# Patient Record
Sex: Female | Born: 1970 | Race: White | Hispanic: No | Marital: Married | Smoking: Never smoker
Health system: Southern US, Community
[De-identification: ages and names within clinical notes are randomized; demographics above are authoritative.]

---

## 2018-06-26 ENCOUNTER — Emergency Department (HOSPITAL_COMMUNITY)
Admission: EM | Admit: 2018-06-26 | Discharge: 2018-06-26 | Disposition: A | Discharge status: Home / Self Care | Payer: Self-pay | Attending: Emergency Medicine | Admitting: Emergency Medicine

## 2018-06-26 ENCOUNTER — Encounter (HOSPITAL_COMMUNITY): Payer: Self-pay

## 2018-06-26 ENCOUNTER — Emergency Department (HOSPITAL_COMMUNITY): Payer: Self-pay

## 2018-06-26 ENCOUNTER — Other Ambulatory Visit: Payer: Self-pay

## 2018-06-26 DIAGNOSIS — D259 Leiomyoma of uterus, unspecified: Secondary | ICD-10-CM | POA: Insufficient documentation

## 2018-06-26 DIAGNOSIS — R339 Retention of urine, unspecified: Secondary | ICD-10-CM | POA: Insufficient documentation

## 2018-06-26 DIAGNOSIS — D219 Benign neoplasm of connective and other soft tissue, unspecified: Secondary | ICD-10-CM

## 2018-06-26 LAB — CBC WITH DIFFERENTIAL/PLATELET
Abs Immature Granulocytes: 0.1 10*3/uL (ref 0.0–0.1)
Basophils Absolute: 0 10*3/uL (ref 0.0–0.1)
Basophils Relative: 0 %
EOS PCT: 1 %
Eosinophils Absolute: 0.1 10*3/uL (ref 0.0–0.7)
HEMATOCRIT: 40.2 % (ref 36.0–46.0)
HEMOGLOBIN: 12.9 g/dL (ref 12.0–15.0)
Immature Granulocytes: 1 %
LYMPHS ABS: 1.7 10*3/uL (ref 0.7–4.0)
LYMPHS PCT: 17 %
MCH: 28.2 pg (ref 26.0–34.0)
MCHC: 32.1 g/dL (ref 30.0–36.0)
MCV: 87.8 fL (ref 78.0–100.0)
Monocytes Absolute: 0.7 10*3/uL (ref 0.1–1.0)
Monocytes Relative: 7 %
Neutro Abs: 7.4 10*3/uL (ref 1.7–7.7)
Neutrophils Relative %: 74 %
Platelets: 254 10*3/uL (ref 150–400)
RBC: 4.58 MIL/uL (ref 3.87–5.11)
RDW: 14 % (ref 11.5–15.5)
WBC: 10 10*3/uL (ref 4.0–10.5)

## 2018-06-26 LAB — I-STAT CHEM 8, ED
BUN: 11 mg/dL (ref 6–20)
CREATININE: 0.7 mg/dL (ref 0.44–1.00)
Calcium, Ion: 1.06 mmol/L — ABNORMAL LOW (ref 1.15–1.40)
Chloride: 107 mmol/L (ref 98–111)
Glucose, Bld: 121 mg/dL — ABNORMAL HIGH (ref 70–99)
HCT: 40 % (ref 36.0–46.0)
Hemoglobin: 13.6 g/dL (ref 12.0–15.0)
Potassium: 4.1 mmol/L (ref 3.5–5.1)
Sodium: 134 mmol/L — ABNORMAL LOW (ref 135–145)
TCO2: 17 mmol/L — AB (ref 22–32)

## 2018-06-26 LAB — URINALYSIS, ROUTINE W REFLEX MICROSCOPIC
BILIRUBIN URINE: NEGATIVE
GLUCOSE, UA: NEGATIVE mg/dL
HGB URINE DIPSTICK: NEGATIVE
Ketones, ur: NEGATIVE mg/dL
Leukocytes, UA: NEGATIVE
NITRITE: POSITIVE — AB
PH: 5 (ref 5.0–8.0)
PROTEIN: NEGATIVE mg/dL
Specific Gravity, Urine: 1.009 (ref 1.005–1.030)

## 2018-06-26 LAB — I-STAT CG4 LACTIC ACID, ED: Lactic Acid, Venous: 2.26 mmol/L (ref 0.5–1.9)

## 2018-06-26 LAB — I-STAT BETA HCG BLOOD, ED (MC, WL, AP ONLY): I-stat hCG, quantitative: <5 m[IU]/mL (ref <5)

## 2018-06-26 MED ORDER — MORPHINE SULFATE (PF) 4 MG/ML IV SOLN
4.0000 mg | Freq: Once | INTRAVENOUS | Status: AC
  Administered 2018-06-26: 4 mg via INTRAVENOUS
  Filled 2018-06-26: qty 1

## 2018-06-26 MED ORDER — PHENAZOPYRIDINE HCL 100 MG PO TABS
200.0000 mg | ORAL_TABLET | Freq: Once | ORAL | Status: DC
  Filled 2018-06-26: qty 2

## 2018-06-26 MED ORDER — CEPHALEXIN 250 MG PO CAPS
500.0000 mg | ORAL_CAPSULE | Freq: Once | ORAL | Status: DC
  Filled 2018-06-26: qty 2

## 2018-06-26 MED ORDER — ONDANSETRON HCL 4 MG/2ML IJ SOLN
4.0000 mg | Freq: Once | INTRAMUSCULAR | Status: AC
  Administered 2018-06-26: 4 mg via INTRAVENOUS
  Filled 2018-06-26: qty 2

## 2018-06-26 MED ORDER — CEPHALEXIN 500 MG PO CAPS: 500.0000 mg | ORAL_CAPSULE | Freq: Four times a day (QID) | ORAL | 0 refills | Status: AC

## 2018-06-26 MED ORDER — SODIUM CHLORIDE 0.9 % IV BOLUS
500.0000 mL | Freq: Once | INTRAVENOUS | Status: AC
  Administered 2018-06-26: 500 mL via INTRAVENOUS

## 2018-06-26 MED ORDER — PHENAZOPYRIDINE HCL 200 MG PO TABS: 200.0000 mg | ORAL_TABLET | Freq: Three times a day (TID) | ORAL | 0 refills | Status: AC | PRN

## 2018-06-26 NOTE — ED Notes (Signed)
Patient ambulatory to bathroom with steady gait at this time 

## 2018-06-26 NOTE — ED Triage Notes (Signed)
Pt here from home for painful urination and urinating blood.  Patient states had similar symptoms a week ago.  States she attempted to use the restroom and had bloody urination and some diarrhea.  Also feels like she is retaining urine.

## 2018-06-26 NOTE — ED Notes (Signed)
Patient verbalizes understanding of discharge instructions. Opportunity for questioning and answers were provided. Armband removed by staff, pt discharged from ED. Pt also states husband is driving her home d/t morphine administration.

## 2018-06-26 NOTE — ED Provider Notes (Signed)
[facility] EMERGENCY DEPARTMENT Provider Note   CSN: [id number] Arrival date & time: 06/26/18  0606     History   Chief Complaint Chief Complaint  Patient presents with  . Hematuria    HPI Audrey Hayes is a 47 y.o. female.  Pt presents to the ED today with the inability to urinate.  She said she had dysuria last week and it went away after drinking cranberry juice.  She had some blood come out when she urinate.  She denies any back pain.  She has never had anything like this in the past.     History reviewed. No pertinent past medical history.  There are no active problems to display for this patient.   History reviewed. No pertinent surgical history.   OB History   None      Home Medications    Prior to Admission medications   Medication Sig Start Date End Date Taking? Authorizing Provider  cephALEXin (KEFLEX) 500 MG capsule Take 1 capsule (500 mg total) by mouth 4 (four) times daily. 06/26/18   Isla Pence, MD  phenazopyridine (PYRIDIUM) 200 MG tablet Take 1 tablet (200 mg total) by mouth 3 (three) times daily as needed for pain. 06/26/18   Isla Pence, MD    Family History History reviewed. No pertinent family history.  Social History Social History   Tobacco Use  . Smoking status: Never Smoker  . Smokeless tobacco: Never Used  Substance Use Topics  . Alcohol use: Never    Frequency: Never  . Drug use: Never     Allergies   Patient has no known allergies.   Review of Systems Review of Systems  Genitourinary: Positive for difficulty urinating, dysuria and hematuria.  All other systems reviewed and are negative.    Physical Exam Updated Vital Signs BP 130/68   Pulse 85   Temp 98.8 F (37.1 C) (Oral)   Resp 18   LMP 06/11/2018 (Within Days)   SpO2 98%   Physical Exam  Constitutional: She is oriented to person, place, and time. She appears well-developed and well-nourished.  HENT:  Head: Normocephalic and  atraumatic.  Right Ear: External ear normal.  Left Ear: External ear normal.  Nose: Nose normal.  Mouth/Throat: Oropharynx is clear and moist.  Eyes: Pupils are equal, round, and reactive to light. Conjunctivae and EOM are normal.  Neck: Normal range of motion. Neck supple.  Cardiovascular: Normal rate, regular rhythm, normal heart sounds and intact distal pulses.  Pulmonary/Chest: Effort normal and breath sounds normal.  Abdominal: Soft. Bowel sounds are normal. There is tenderness in the suprapubic area.  Musculoskeletal: Normal range of motion.  Neurological: She is alert and oriented to person, place, and time.  Skin: Skin is warm. Capillary refill takes less than 2 seconds.  Psychiatric: She has a normal mood and affect. Her behavior is normal. Judgment and thought content normal.  Nursing note and vitals reviewed.    ED Treatments / Results  Labs (all labs ordered are listed, but only abnormal results are displayed) Labs Reviewed  URINALYSIS, ROUTINE W REFLEX MICROSCOPIC - Abnormal; Notable for the following components:      Result Value   Color, Urine AMBER (*)    Nitrite POSITIVE (*)    Bacteria, UA RARE (*)    All other components within normal limits  I-STAT CHEM 8, ED - Abnormal; Notable for the following components:   Sodium 134 (*)    Glucose, Bld 121 (*)  Calcium, Ion 1.06 (*)    TCO2 17 (*)    All other components within normal limits  I-STAT CG4 LACTIC ACID, ED - Abnormal; Notable for the following components:   Lactic Acid, Venous 2.26 (*)    All other components within normal limits  URINE CULTURE  CBC WITH DIFFERENTIAL/PLATELET  I-STAT BETA HCG BLOOD, ED ([facility], [facility], [facility] ONLY)  I-STAT BETA HCG BLOOD, ED ([facility], [facility], [facility] ONLY)    EKG None  Radiology Ct Renal Stone Study  Result Date: 06/26/2018 CLINICAL DATA:  47 year old female with a history of lower abdominal pain and urinary urgency EXAM: CT ABDOMEN AND PELVIS WITHOUT CONTRAST TECHNIQUE: Multidetector CT  imaging of the abdomen and pelvis was performed following the standard protocol without IV contrast. COMPARISON:  None. FINDINGS: Lower chest: No acute abnormality. Hepatobiliary: Diffusely decreased attenuation of liver parenchyma. Hyperdense material layered in the dependent aspect of the gallbladder. No pericholecystic fluid or inflammatory changes. No intrahepatic or extrahepatic biliary ductal dilatation. Pancreas: Unremarkable pancreas Spleen: Unremarkable spleen Adrenals/Urinary Tract: Unremarkable adrenal glands. Right kidney and left kidney without hydronephrosis. No nephrolithiasis. Unremarkable course the bilateral ureters. Urinary bladder relatively decompressed. Urinary bladder is displaced anteriorly with impression on the bladder base secondary to the uterus. Stomach/Bowel: Unremarkable stomach. Small duodenal diverticulum. Unremarkable appearance of small bowel with no abnormal distention. No transition point. No focal inflammatory changes. No wall thickening. Normal appendix. Mild diverticular change of the sigmoid colon. No associated inflammatory changes. Vascular/Lymphatic: No significant vascular calcifications. No lymphadenopathy. Reproductive: Uterus is enlarged with evidence of fibroid changes. There appears to be a large posterior fibroid, though incompletely characterized on this CT, with diameter on the axial image of 12 cm x 10 cm. Follicular changes of the adnexa. Other: No abdominal wall hernia or abnormality. No abdominopelvic ascites. Musculoskeletal: No acute displaced fracture. No significant degenerative changes IMPRESSION: No acute CT finding to account for abdominal pain. Enlarged uterus, and though is incompletely characterized on the current noncontrast CT, demonstrates fibroid changes with a large posterior fibroid measuring 12 cm x 10 cm, up lifting the urinary bladder. Referral for Ob evaluation is recommended. 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  Steatosis Electronically Signed   By: Corrie Mckusick D.O.   On: 06/26/2018 09:22    Procedures Procedures (including critical care time)  Medications Ordered in ED Medications  phenazopyridine (PYRIDIUM) tablet 200 mg (has no administration in time range)  cephALEXin (KEFLEX) capsule 500 mg (has no administration in time range)  sodium chloride 0.9 % bolus 500 mL (0 mLs Intravenous Stopped 06/26/18 0823)  morphine 4 MG/ML injection 4 mg (4 mg Intravenous Given 06/26/18 0714)  ondansetron (ZOFRAN) injection 4 mg (4 mg Intravenous Given 06/26/18 0714)  morphine 4 MG/ML injection 4 mg (4 mg Intravenous Given 06/26/18 0943)     Initial Impression / Assessment and Plan / ED Course  I have reviewed the triage vital signs and the nursing notes.  Pertinent labs & imaging results that were available during my care of the patient were reviewed by me and considered in my medical decision making (see chart for details).    Pt had 900 cc of urine in bladder, so she was cath'd.  She was able to urinate only about 50-100 cc after this.  However, post void residual was over 500, so a foley was placed.  Pt has very large fibroids which I think may be the cause of urinary retention.  She does not take any meds which would cause  it.  She just moved here from [place] and does not have a doctor. She has been told before that she has large fibroids.  Hyst was recommended, but she decided to hold off on it.  She is given the number of women's clinic and urology.  She knows to return here or to [facility] in 3-4 days to have foley removed as it's unlikely she will be able to get an appt as she does not have insurance.  She will be started on keflex b/c of the dysuria and the foley.  She is feeling much better.  She knows to return if worse.  Final Clinical Impressions(s) / ED Diagnoses   Final diagnoses:  Urinary retention  Fibroids    ED Discharge Orders         Ordered    cephALEXin (KEFLEX) 500 MG capsule  4 times  daily     06/26/18 1014    phenazopyridine (PYRIDIUM) 200 MG tablet  3 times daily PRN     06/26/18 1014           Isla Pence, MD 06/26/18 1019

## 2018-06-26 NOTE — ED Notes (Signed)
Nurse starting IV and will draw labs. 

## 2018-06-26 NOTE — ED Notes (Signed)
Patient transported to CT 

## 2018-06-26 NOTE — ED Notes (Signed)
BBladdr Scan reads 715 ML

## 2018-06-26 NOTE — ED Notes (Signed)
Successful in and out catheterization done. Output 926mLs. Patient reports decreased pain at 3/10.

## 2018-06-27 LAB — URINE CULTURE: CULTURE: NO GROWTH

## 2019-08-19 IMAGING — CT CT RENAL STONE PROTOCOL
2 of 4 series · 16 of 46 positions shown, 18 images · non-contrast
Comparison: None.

CLINICAL DATA: 47-year-old female with a history of lower abdominal
pain and urinary urgency

EXAM:
CT ABDOMEN AND PELVIS WITHOUT CONTRAST
TECHNIQUE: Multidetector CT imaging of the abdomen and pelvis was performed
following the standard protocol without IV contrast.

[Series 3: ap without · axial · non-contrast · 0.72mm/px · z∈[+791,+1176]mm · 13 of 89 slices shown, 15 images]
[im 6/89  soft-tissue]
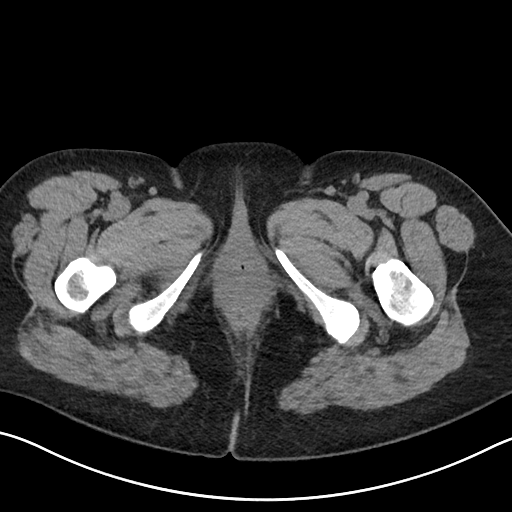
[im 6/89  bone]
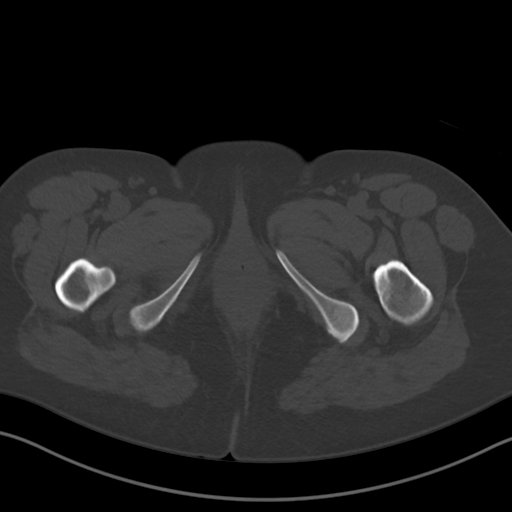
[im 11/89  soft-tissue]
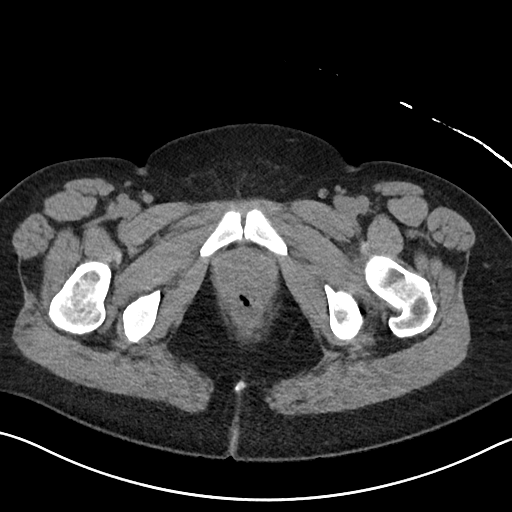
[im 21/89  soft-tissue]
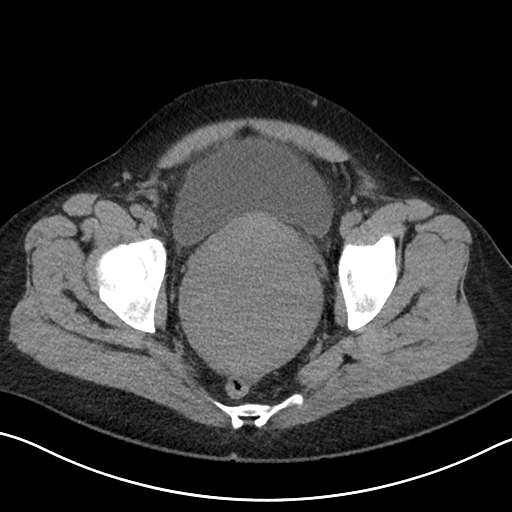
[im 26/89  soft-tissue]
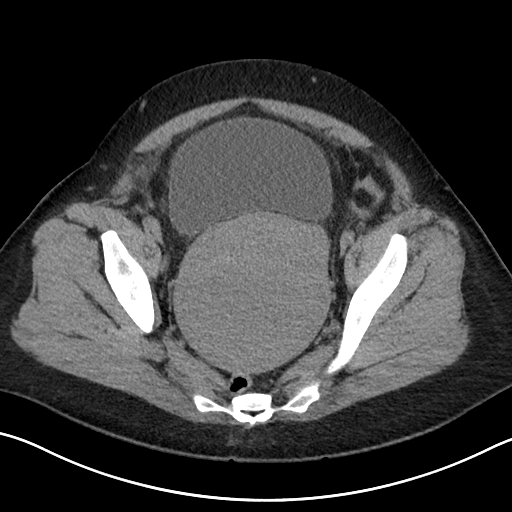
[im 32/89  soft-tissue]
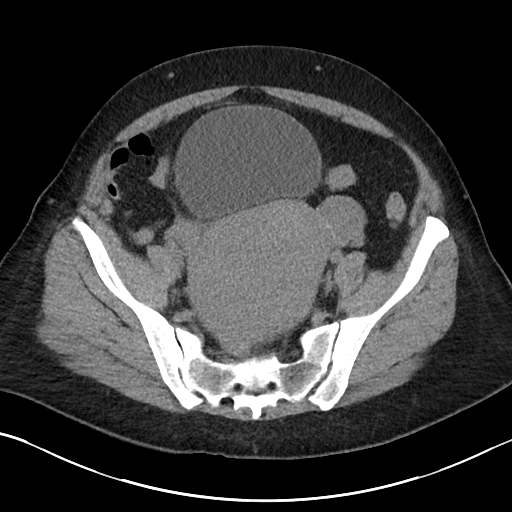
[im 37/89  soft-tissue]
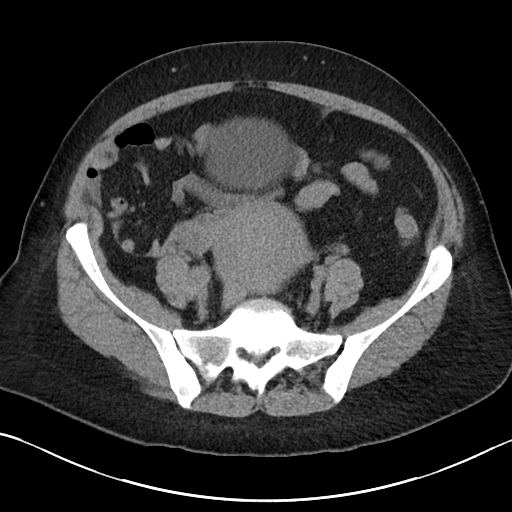
[im 47/89  soft-tissue]
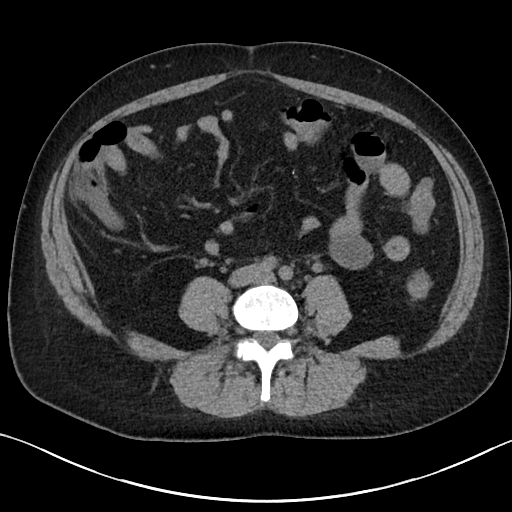
[im 52/89  soft-tissue]
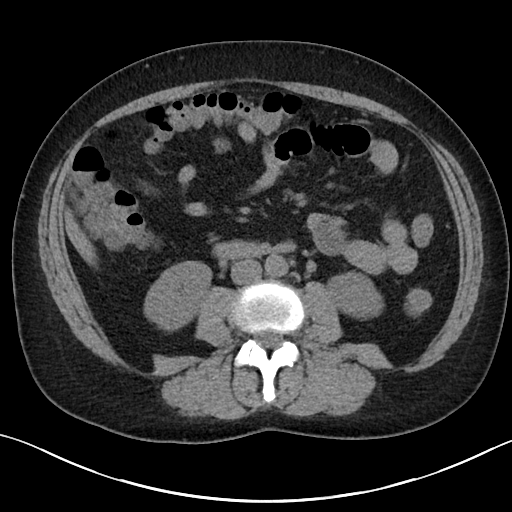
[im 57/89  soft-tissue]
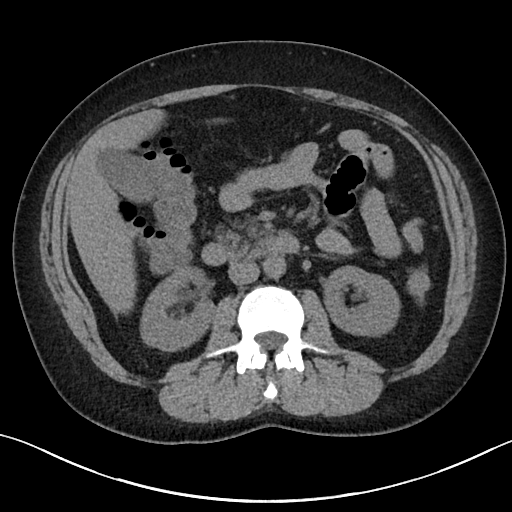
[im 57/89  bone]
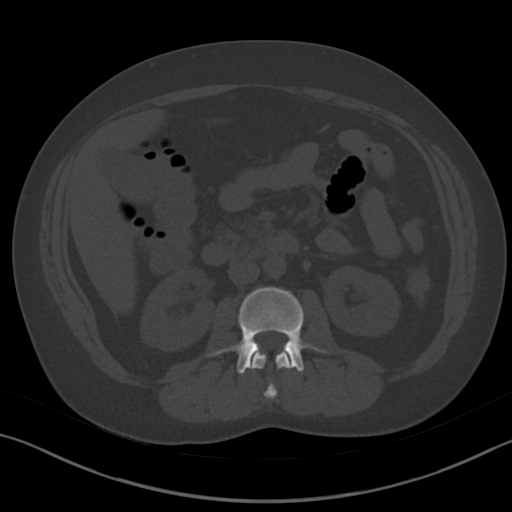
[im 63/89  soft-tissue]
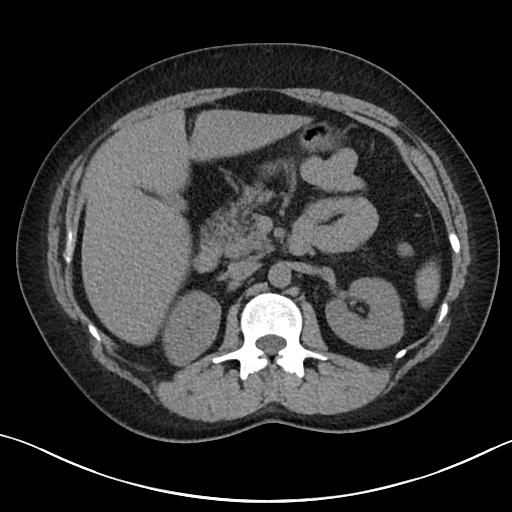
[im 68/89  soft-tissue]
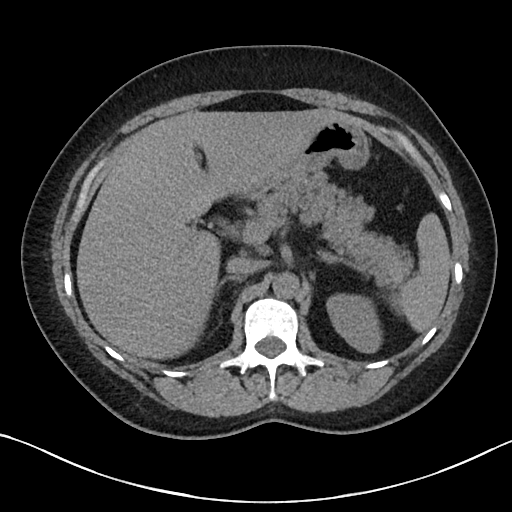
[im 78/89  soft-tissue]
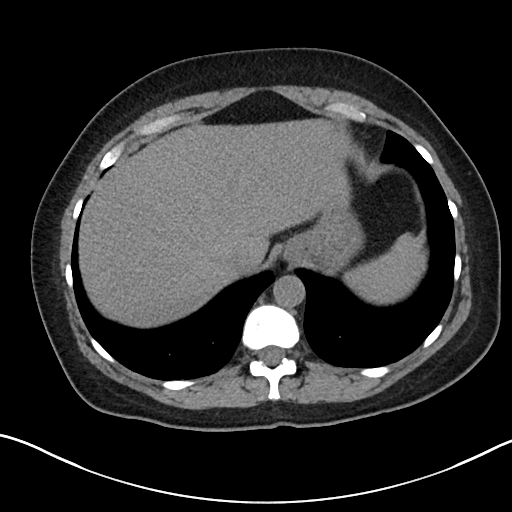
[im 83/89  soft-tissue]
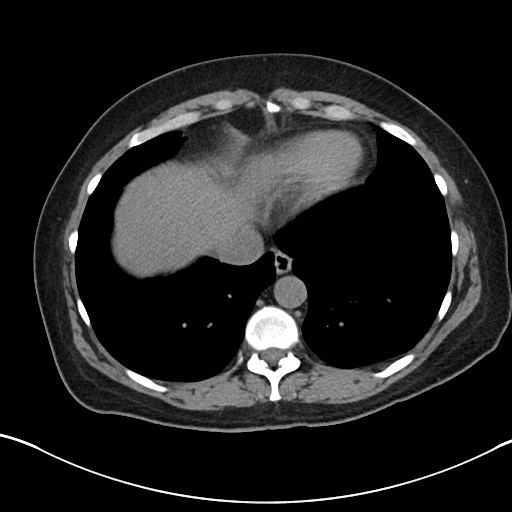

[Series 6: cor · coronal · 0.73mm/px · 3 of 86 slices shown]
[im 29/86  soft-tissue]
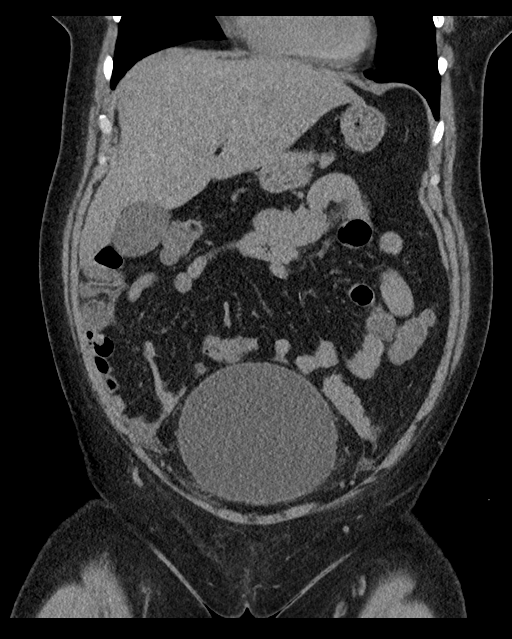
[im 38/86  soft-tissue]
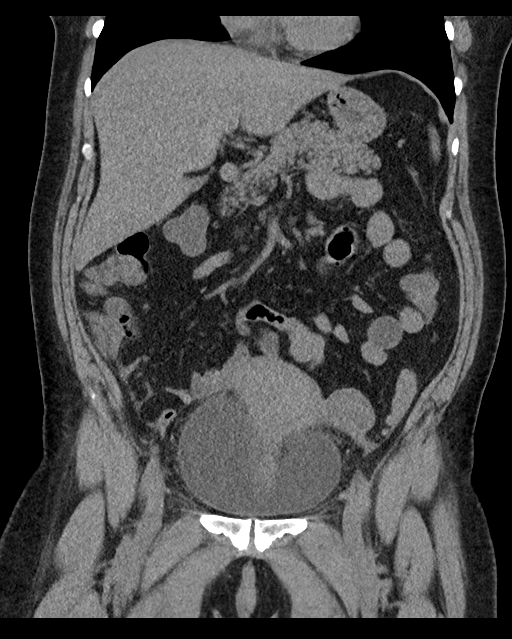
[im 48/86  soft-tissue]
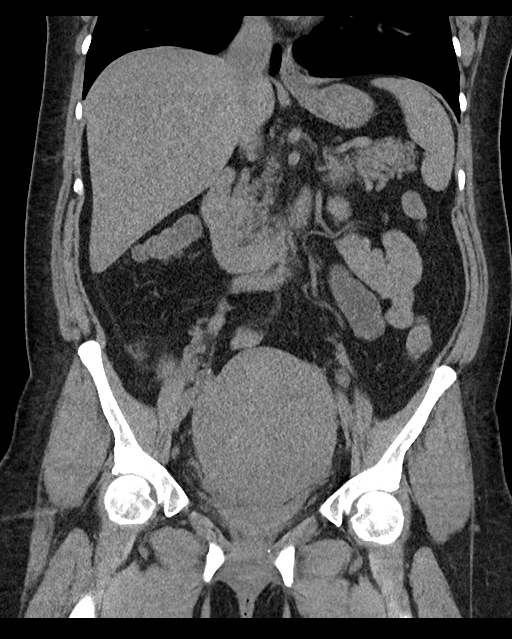

[16 of 46 positions shown; findings below may reference images not displayed]

FINDINGS: Lower chest: No acute abnormality.

Hepatobiliary: Diffusely decreased attenuation of liver parenchyma.
Hyperdense material layered in the dependent aspect of the
gallbladder. No pericholecystic fluid or inflammatory changes. No
intrahepatic or extrahepatic biliary ductal dilatation.

Pancreas: Unremarkable pancreas

Spleen: Unremarkable spleen

Adrenals/Urinary Tract: Unremarkable adrenal glands.

Right kidney and left kidney without hydronephrosis. No
nephrolithiasis. Unremarkable course the bilateral ureters. Urinary
bladder relatively decompressed.

Urinary bladder is displaced anteriorly with impression on the
bladder base secondary to the uterus.

Stomach/Bowel: Unremarkable stomach. Small duodenal diverticulum.
Unremarkable appearance of small bowel with no abnormal distention.
No transition point. No focal inflammatory changes. No wall
thickening. Normal appendix. Mild diverticular change of the sigmoid
colon. No associated inflammatory changes.

Vascular/Lymphatic: No significant vascular calcifications.

No lymphadenopathy.

Reproductive: Uterus is enlarged with evidence of fibroid changes.
There appears to be a large posterior fibroid, though incompletely
characterized on this CT, with diameter on the axial image of 12 cm
x 10 cm. Follicular changes of the adnexa.

Other: No abdominal wall hernia or abnormality. No abdominopelvic
ascites.

Musculoskeletal: No acute displaced fracture. No significant
degenerative changes
IMPRESSION: No acute CT finding to account for abdominal pain.

Enlarged uterus, and though is incompletely characterized on the
current noncontrast CT, demonstrates fibroid changes with a large
posterior fibroid measuring 12 cm x 10 cm, up lifting the urinary
bladder. Referral for Ob evaluation is recommended.

Diverticular change without evidence of acute diverticulitis.

Steatosis
